# Patient Record
Sex: Female | Born: 1955 | Race: Black or African American | Hispanic: No | Marital: Single | Smoking: Current every day smoker
Health system: Southern US, Community
[De-identification: ages and names within clinical notes are randomized; demographics above are authoritative.]

## PROBLEM LIST (undated history)

## (undated) DIAGNOSIS — E785 Hyperlipidemia, unspecified: Secondary | ICD-10-CM

---

## 2018-03-20 ENCOUNTER — Emergency Department (HOSPITAL_COMMUNITY): Payer: Medicare Other

## 2018-03-20 ENCOUNTER — Other Ambulatory Visit: Payer: Self-pay

## 2018-03-20 ENCOUNTER — Emergency Department (HOSPITAL_COMMUNITY)
Admission: EM | Admit: 2018-03-20 | Discharge: 2018-03-20 | Disposition: A | Discharge status: Home / Self Care | Payer: Medicare Other | Attending: Emergency Medicine | Admitting: Emergency Medicine

## 2018-03-20 ENCOUNTER — Encounter (HOSPITAL_COMMUNITY): Payer: Self-pay | Admitting: *Deleted

## 2018-03-20 DIAGNOSIS — I6523 Occlusion and stenosis of bilateral carotid arteries: Secondary | ICD-10-CM | POA: Diagnosis not present

## 2018-03-20 DIAGNOSIS — Y93A1 Activity, exercise machines primarily for cardiorespiratory conditioning: Secondary | ICD-10-CM | POA: Diagnosis not present

## 2018-03-20 DIAGNOSIS — M79602 Pain in left arm: Secondary | ICD-10-CM | POA: Diagnosis present

## 2018-03-20 DIAGNOSIS — W19XXXA Unspecified fall, initial encounter: Secondary | ICD-10-CM

## 2018-03-20 DIAGNOSIS — W010XXA Fall on same level from slipping, tripping and stumbling without subsequent striking against object, initial encounter: Secondary | ICD-10-CM | POA: Diagnosis not present

## 2018-03-20 DIAGNOSIS — F17200 Nicotine dependence, unspecified, uncomplicated: Secondary | ICD-10-CM | POA: Diagnosis not present

## 2018-03-20 DIAGNOSIS — R52 Pain, unspecified: Secondary | ICD-10-CM

## 2018-03-20 DIAGNOSIS — R0789 Other chest pain: Secondary | ICD-10-CM | POA: Insufficient documentation

## 2018-03-20 DIAGNOSIS — Y998 Other external cause status: Secondary | ICD-10-CM | POA: Diagnosis not present

## 2018-03-20 DIAGNOSIS — R002 Palpitations: Secondary | ICD-10-CM | POA: Diagnosis not present

## 2018-03-20 DIAGNOSIS — Y92009 Unspecified place in unspecified non-institutional (private) residence as the place of occurrence of the external cause: Secondary | ICD-10-CM | POA: Diagnosis not present

## 2018-03-20 HISTORY — DX: Hyperlipidemia, unspecified: E78.5

## 2018-03-20 LAB — BASIC METABOLIC PANEL
Anion gap: 10 (ref 5–15)
BUN: 8 mg/dL (ref 6–20)
CALCIUM: 9.6 mg/dL (ref 8.9–10.3)
CHLORIDE: 108 mmol/L (ref 101–111)
CO2: 24 mmol/L (ref 22–32)
CREATININE: 0.74 mg/dL (ref 0.44–1.00)
GFR calc non Af Amer: >60 mL/min (ref >60)
GLUCOSE: 92 mg/dL (ref 65–99)
Potassium: 3.8 mmol/L (ref 3.5–5.1)
Sodium: 142 mmol/L (ref 135–145)

## 2018-03-20 LAB — CBC WITH DIFFERENTIAL/PLATELET
Basophils Absolute: 0 10*3/uL (ref 0.0–0.1)
Basophils Relative: 1 %
EOS ABS: 0.1 10*3/uL (ref 0.0–0.7)
EOS PCT: 2 %
HCT: 39.4 % (ref 36.0–46.0)
HEMOGLOBIN: 12.9 g/dL (ref 12.0–15.0)
LYMPHS PCT: 60 %
Lymphs Abs: 2.2 10*3/uL (ref 0.7–4.0)
MCH: 29.3 pg (ref 26.0–34.0)
MCHC: 32.7 g/dL (ref 30.0–36.0)
MCV: 89.3 fL (ref 78.0–100.0)
Monocytes Absolute: 0.2 10*3/uL (ref 0.1–1.0)
Monocytes Relative: 6 %
NEUTROS ABS: 1.1 10*3/uL — AB (ref 1.7–7.7)
NEUTROS PCT: 31 %
Platelets: 241 10*3/uL (ref 150–400)
RBC: 4.41 MIL/uL (ref 3.87–5.11)
RDW: 11.9 % (ref 11.5–15.5)
WBC: 3.6 10*3/uL — ABNORMAL LOW (ref 4.0–10.5)

## 2018-03-20 LAB — I-STAT TROPONIN, ED: TROPONIN I, POC: 0 ng/mL (ref 0.00–0.08)

## 2018-03-20 NOTE — ED Provider Notes (Signed)
Patient with mechanical fall several days ago,  Walking on a treadmill occurred she also complains of "palpitations" for several days which come and go.  She denies any chest pain denies lightheadedness.  She is not experiencing any palpitations presently   Doug SouJacubowitz, Ashtin Melichar, MD 03/20/18 1512

## 2018-03-20 NOTE — ED Triage Notes (Signed)
Pt in stating she tripped and fell on her treadmill last week and has ongoing pain to her left arm, pain worse with movement

## 2018-03-20 NOTE — ED Notes (Signed)
Radiology to bring PT back to [facility]

## 2018-03-20 NOTE — ED Notes (Signed)
ED Provider at bedside. 

## 2018-03-20 NOTE — Discharge Instructions (Addendum)
Please take Tylenol (acetaminophen) to relieve your pain.  You may take tylenol, up to 1,000 mg (two extra strength pills).  Do not take more than 3,000 mg tylenol in a 24 hour period.  Please check all medication labels as many medications such as pain and cold medications may contain tylenol. Please do not drink alcohol while taking this medication.   Today the CT scan of your neck showed that you have some wear and tear type changes, however your hardware looks like it is in the right place.  The CT on your neck also showed that you have calcium in both your carotid arteries which are the big arteries that go to your head.  Your blood work looked reassuring, however your white blood cell count was slightly low.  This may be your normal, however recommend that you follow-up with your primary care doctor for both of these, along with your palpitations, ED visit, and symptoms today.  If you have worsening pain, or additional concerns then please seek additional medical care and evaluation.  Please make sure that you are safe when using a treadmill.

## 2018-03-20 NOTE — ED Provider Notes (Signed)
[facility] EMERGENCY DEPARTMENT Provider Note   CSN: [id number] Arrival date & time: 03/20/18  1007     History   Chief Complaint Chief Complaint  Patient presents with  . Fall    HPI Jaclyn Dawson is a 62 y.o. female with a past medical history of hypertension, hyperlipidemia, who presents today for evaluation after she fell off the treadmill on Wednesday.  Jaclyn Dawson reports that since then she has had ongoing left-sided arm and rib pain.  She has been trying heat, and lidocaine cream without relief.  She also reports that whenever she moves around or bends down she feels like her heart muscle is spasming.  She denies any known cardiac history, diaphoresis, nausea or vomiting.  She reports that she has a metal plate in her neck and is concerned that when she fell she may have displaced it.  She however denies neck pain worse than her usual chronic pains.  She reports decreased sensation in her left hand in all fingers except the thumb.  She denies striking her head, reports that this was a mechanical fall as the treadmill was moving too quickly for her.  HPI  Past Medical History:  Diagnosis Date  . Hyperlipemia     Patient Active Problem List   Diagnosis Date Noted  . 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 No pertinent surgical history.   OB History   None      Home Medications    Prior to Admission medications   Not on File    Family History History reviewed. No pertinent family history.  Social History Social History   Tobacco Use  . Smoking status: Current Every Day Smoker  . Smokeless tobacco: Never Used  Substance Use Topics  . Alcohol use: Not on file  . Drug use: Not on file     Allergies   Aspirin; Penicillins; and Tramadol   Review of Systems Review of Systems  Constitutional: Negative for activity change, fatigue and fever.  Eyes: Negative for visual disturbance.  Respiratory: Negative for  shortness of breath.   Cardiovascular: Positive for chest pain and palpitations. Negative for leg swelling.  Musculoskeletal: Positive for neck pain. Negative for back pain and neck stiffness.  Neurological: Negative for weakness and headaches.       Decreased sensation in the left hand.  All other systems reviewed and are negative.    Physical Exam Updated Vital Signs BP 124/90   Pulse 71   Temp 98.2 F (36.8 C) (Oral)   Resp 16   SpO2 99%   Physical Exam  Constitutional: She appears well-developed and well-nourished. No distress.  HENT:  Head: Normocephalic and atraumatic.  Mouth/Throat: Oropharynx is clear and moist.  Eyes: Conjunctivae are normal.  Neck: Normal range of motion. Neck supple.  No midline tenderness to palpation.  Cardiovascular: Normal rate, regular rhythm, normal heart sounds and intact distal pulses. Exam reveals no friction rub.  No murmur heard. Pulmonary/Chest: Effort normal and breath sounds normal. No respiratory distress. She exhibits tenderness (Over left lateral chest.).  Abdominal: Soft. Bowel sounds are normal. She exhibits no distension. There is no tenderness. There is no guarding.  Musculoskeletal: She exhibits no edema.  Patient has generalized pain along the left shoulder.  Unable to re-create the pain with palpation, however it is present with range of motion.  Patient has limited abduction of the left arm secondary to pain, and is not able to raise her arm over  her head.  No obvious crepitus or deformities palpated.  5/5 grip and ankle dorsi/plantar flexion strength bilaterally.  Compartments and bilateral upper and lower extremities are soft and easily compressible.  Neurological: She is alert. She has normal strength. Coordination and gait normal.  Face is symmetrical.  Subjective decreased sensation over the left second through fifth fingers, normal sensation over left thumb.    Skin: Skin is warm and dry.  Psychiatric: She has a normal mood  and affect.  Nursing note and vitals reviewed.    ED Treatments / Results  Labs (all labs ordered are listed, but only abnormal results are displayed) Labs Reviewed  CBC WITH DIFFERENTIAL/PLATELET - Abnormal; Notable for the following components:      Result Value   WBC 3.6 (*)    Neutro Abs 1.1 (*)    All other components within normal limits  BASIC METABOLIC PANEL  I-STAT TROPONIN, ED    EKG     EKG Interpretation  Date/Time:  Friday March 20 2018 13:22:35 EDT Ventricular Rate:  66 PR Interval:    QRS Duration: 95 QT Interval:  402 QTC Calculation: 422 R Axis:   73 Text Interpretation:  Sinus rhythm No old tracing to compare Confirmed by Doug SouJacubowitz, Sam [phone]([id number]) on 03/20/2018 1:33:46 PM Also confirmed by Doug SouJacubowitz, Sam [phone]([id number]), editor Sheppard EvensSimpson, Miranda ([phone])  on 03/20/2018 2:42:55 PM   Radiology Dg Chest 2 View  Result Date: 03/20/2018 CLINICAL DATA:  The left chest and posterior rib pain since a fall 2 weeks ago. Initial encounter. EXAM: CHEST - 2 VIEW COMPARISON:  None. FINDINGS: Calcified granulomata are seen in the right middle lobe. Lungs are otherwise clear. Heart size normal. No pneumothorax or pleural effusion. No acute bony abnormality. The patient is status post cervical fusion. IMPRESSION: No acute disease. Electronically Signed   By: Drusilla Kannerhomas  Dalessio M.D.   On: 03/20/2018 13:44   Dg Ribs Unilateral Left  Result Date: 03/20/2018 CLINICAL DATA:  Larey SeatFell 2 weeks ago, left rib pain and palpitations. EXAM: LEFT RIBS - 2 VIEW COMPARISON:  Chest radiograph March 20, 2018 FINDINGS: No fracture or other bone lesions are seen involving the ribs. IMPRESSION: Negative. Please see dedicated chest radiograph from same day, reported separately. Electronically Signed   By: Awilda Metroourtnay  Bloomer M.D.   On: 03/20/2018 13:44   Ct Cervical Spine Wo Contrast  Result Date: 03/20/2018 CLINICAL DATA:  Left arm pain after tripping and falling on her treadmill last week. EXAM: CT CERVICAL SPINE WITHOUT  CONTRAST TECHNIQUE: Multidetector CT imaging of the cervical spine was performed without intravenous contrast. Multiplanar CT image reconstructions were also generated. COMPARISON:  None. FINDINGS: Alignment: Mild reversal the normal lordosis in the lower cervical spine. No subluxations. Skull base and vertebrae: No acute fracture. No primary bone lesion or focal pathologic process. Soft tissues and spinal canal: No prevertebral fluid or swelling. No visible canal hematoma. Disc levels: Solid interbody bone and anterior hardware fusion at the C3 through C7 levels. C1-2 degenerative changes. Mild facet degenerative changes at multiple levels. Upper chest: Clear lung apices. Other: Bilateral carotid artery calcifications. IMPRESSION: 1. No fracture or subluxation. 2. Postsurgical and degenerative changes, as described above. 3. Bilateral carotid artery atheromatous calcifications. Electronically Signed   By: Beckie SaltsSteven  Reid M.D.   On: 03/20/2018 14:37   Dg Shoulder Left  Result Date: 03/20/2018 CLINICAL DATA:  Fall onto left arm last week with continued pain. Initial encounter. EXAM: LEFT SHOULDER - 2+ VIEW COMPARISON:  None. FINDINGS: There is no  evidence of fracture or dislocation. Mild inferior glenoid spurring. IMPRESSION: No acute finding. Electronically Signed   By: Marnee Spring M.D.   On: 03/20/2018 10:48   Dg Humerus Left  Result Date: 03/20/2018 CLINICAL DATA:  Fall onto left arm last week with continued pain. Initial encounter. EXAM: LEFT HUMERUS - 2+ VIEW COMPARISON:  None. FINDINGS: Negative for fracture or dislocation. Mild acromioclavicular spurring. IMPRESSION: No acute finding. Electronically Signed   By: Marnee Spring M.D.   On: 03/20/2018 10:49    Procedures Procedures (including critical care time)  Medications Ordered in ED Medications - No data to display   Initial Impression / Assessment and Plan / ED Course  I have reviewed the triage vital signs and the nursing  notes.  Pertinent labs & imaging results that were available during my care of the patient were reviewed by me and considered in my medical decision making (see chart for details).    Patient presents today for evaluation of 2 complaints. 1.  Fall: Patient fell on Wednesday, and since then has had pain on her left upper body.  She had subjective decreased sensation in her left hand, raising concern for a neck injury although she does not have neck pain and does not think she strike her head.  She is also had pain in the left side of her chest since this started.  X-rays of left-sided ribs, chest, left shoulder, and left humerus were all obtained without acute abnormalities present.  CT scan of neck was obtained without subluxation or other acute finding.  Patient's tingling is not consistent with a peripheral nerve or dermatomal pattern, low suspicion for emergent cause.  Suspect muscle spasm/contusions causing her pain.  Discussed with patient the need to follow-up with orthopedics that home if her pain continues, she is only visiting [place], and therefore was not referred to a local orthopedist.  Did discuss possibility of soft tissue injury including rotator cuff.    2. Palpitations Patient reports that since her fall she has been having abnormal chest sensations which she equates to palpitations.  Labs were obtained and reviewed, mild leukopenia, initial EKG normal, patient was observed on the heart monitor without cause for her symptoms found.  Recommended caffeine reduction, PCP follow-up.  Return precautions were discussed with patient he states her understanding.  Discharged home with symptomatic care.  She was also informed of incidental findings including bilateral calcification of her carotid arteries and the need to follow-up with her primary care doctor about this.  Final Clinical Impressions(s) / ED Diagnoses   Final diagnoses:  Pain  Fall, initial encounter  Palpitations   Calcification of both carotid arteries    ED Discharge Orders    None       Cristina Gong, Cordelia Poche 03/20/18 1602    Doug Sou, MD 03/20/18 1816

## 2018-03-20 NOTE — ED Notes (Signed)
Patient transported to CT 

## 2018-03-20 NOTE — ED Notes (Signed)
Patient transported to X-ray 

## 2018-03-23 LAB — PATHOLOGIST SMEAR REVIEW

## 2019-02-26 IMAGING — CT CT CERVICAL SPINE W/O CM
3 of 4 series · 12 of 33 positions shown, 14 images · non-contrast
Comparison: None.

CLINICAL DATA: Left arm pain after tripping and falling on her
treadmill last week.

EXAM:
CT CERVICAL SPINE WITHOUT CONTRAST
TECHNIQUE: Multidetector CT imaging of the cervical spine was performed without
intravenous contrast. Multiplanar CT image reconstructions were also
generated.

[Series 4: c_spine 2.0 (person_name) (person_name) · axial · 0.33mm/px · z∈[-184,-64]mm · 4 of 91 slices shown, 5 images]
[im 16/91  soft-tissue]
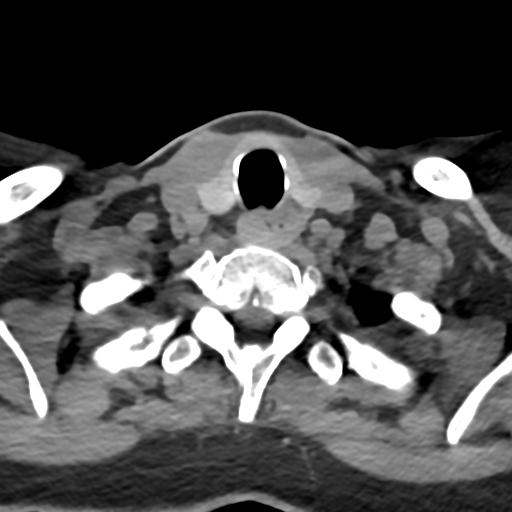
[im 16/91  bone]
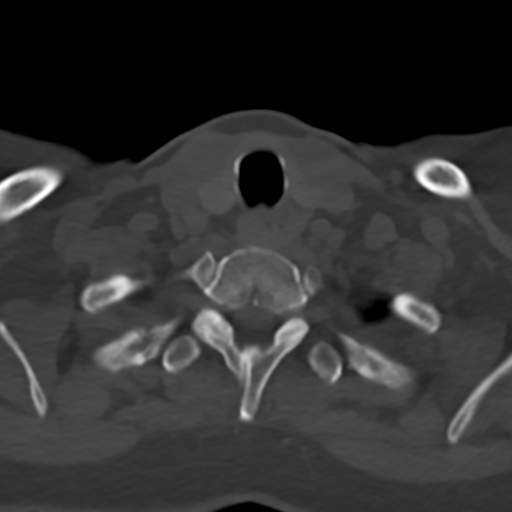
[im 31/91  bone]
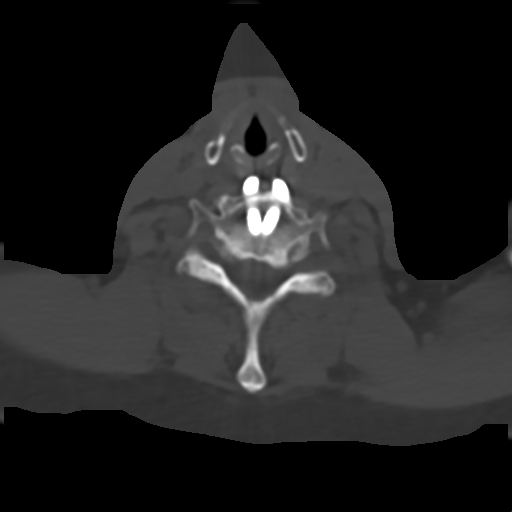
[im 61/91  bone]
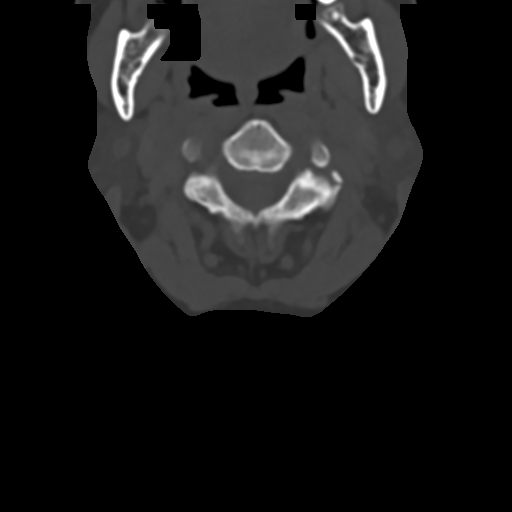
[im 76/91  bone]
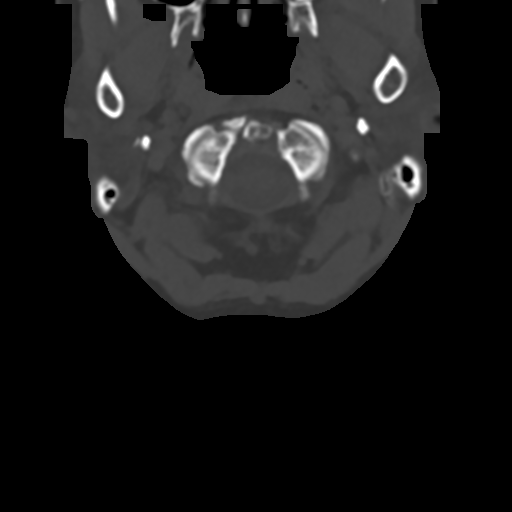

[Series 6: c_spine 2.0 sag bone · sagittal · 0.24mm/px · 5 of 61 slices shown, 6 images]
[im 21/61  bone]
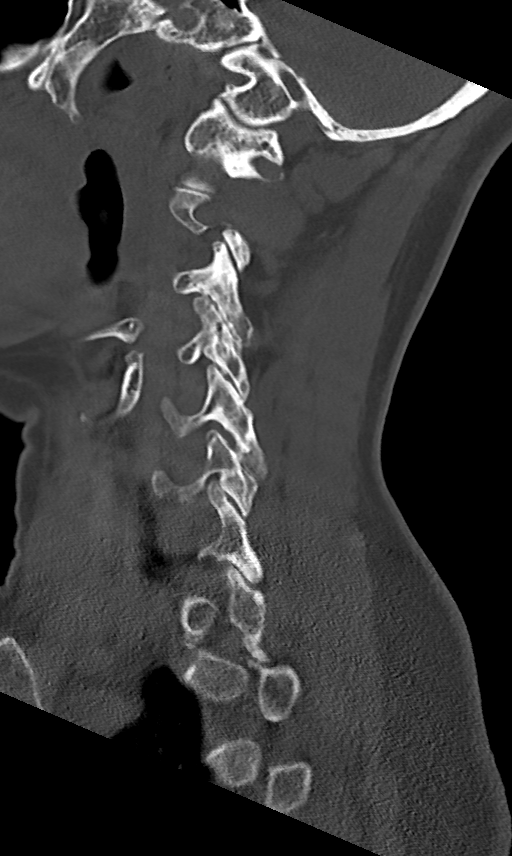
[im 26/61  bone]
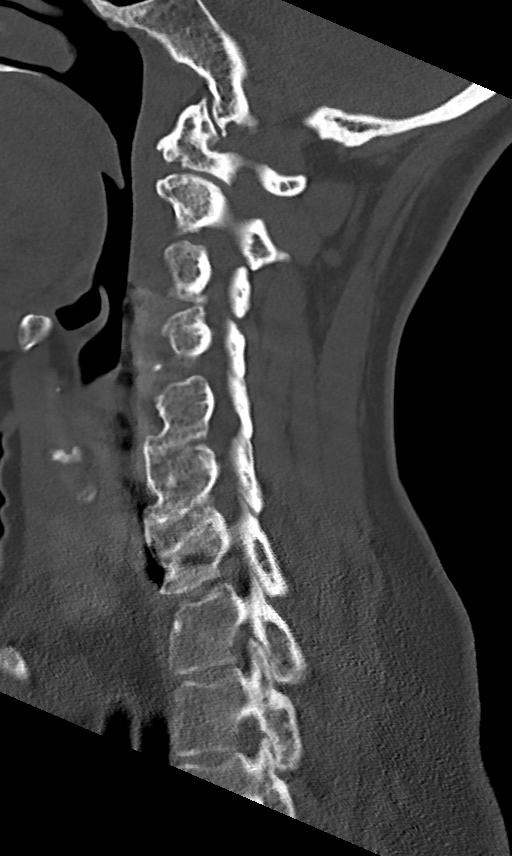
[im 31/61  soft-tissue]
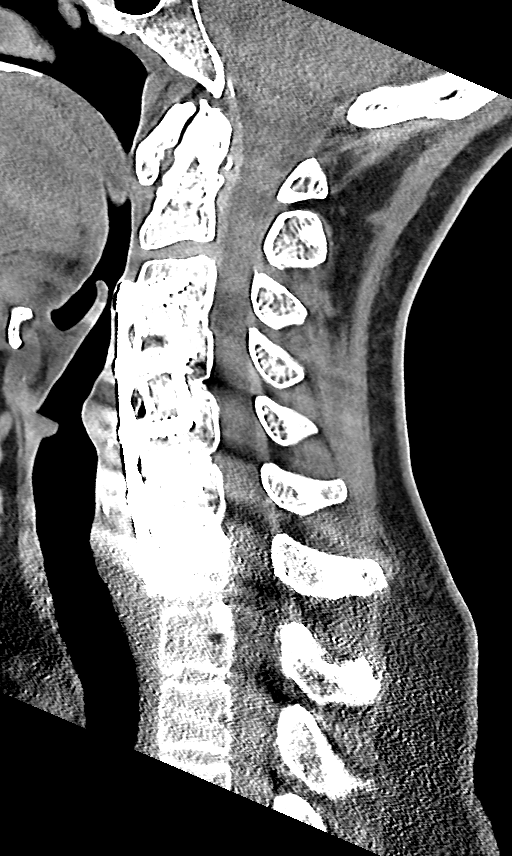
[im 31/61  bone]
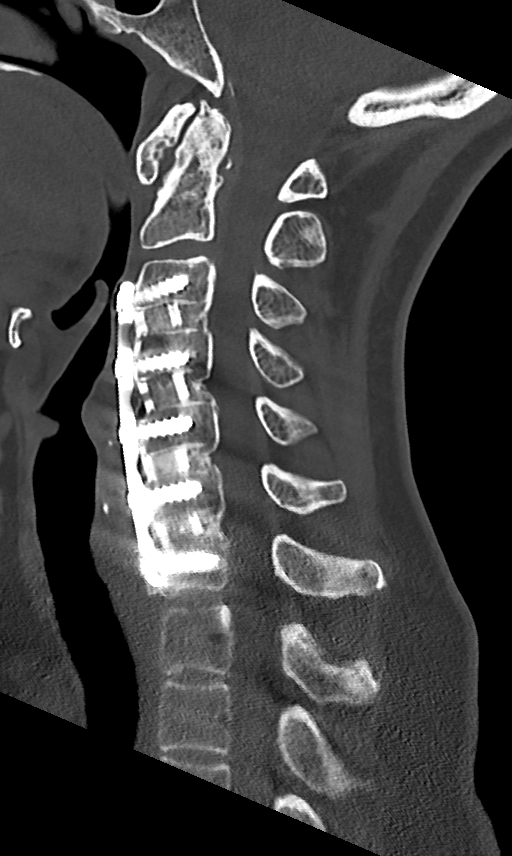
[im 36/61  bone]
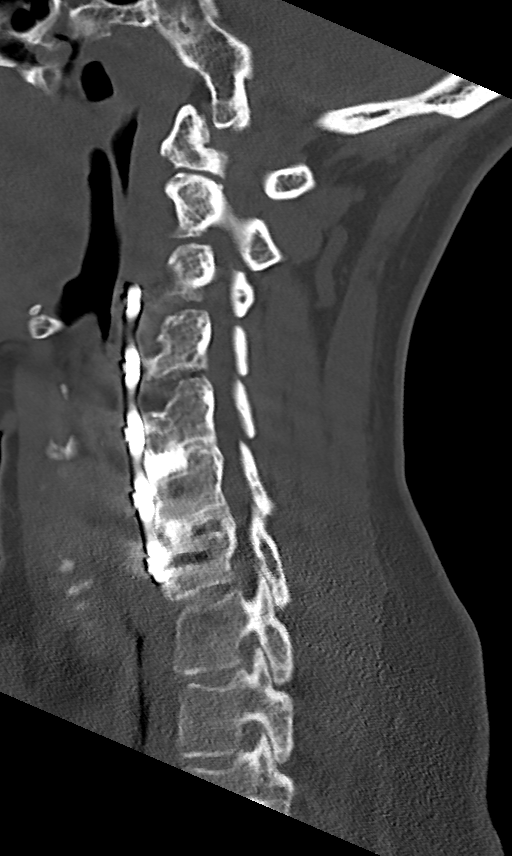
[im 41/61  bone]
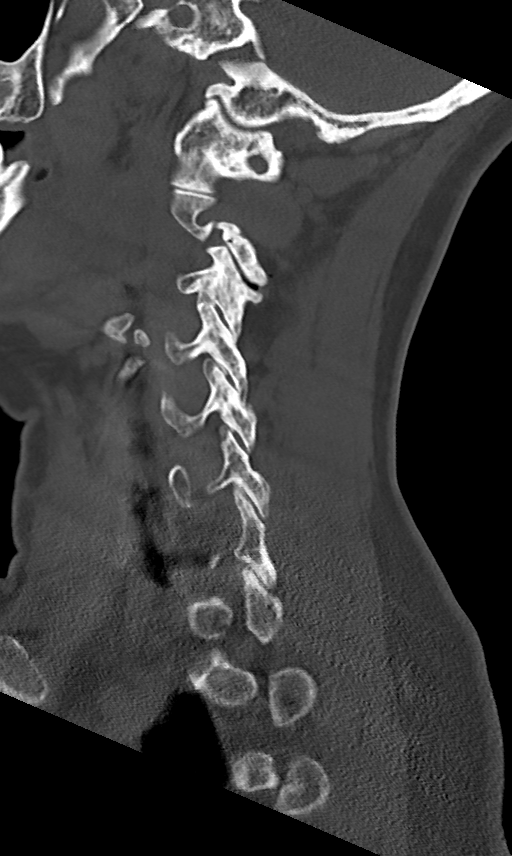

[Series 7: c_spine 2.0 cor bone · coronal · 0.27mm/px · 3 of 61 slices shown]
[im 13/61  bone]
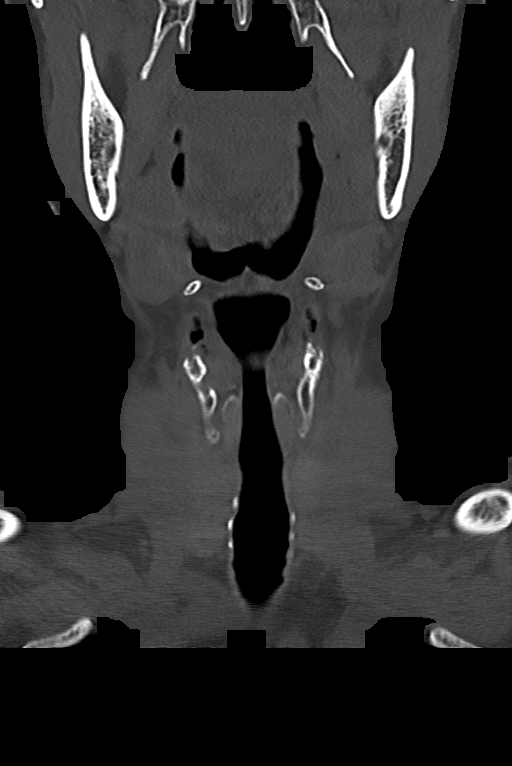
[im 25/61  bone]
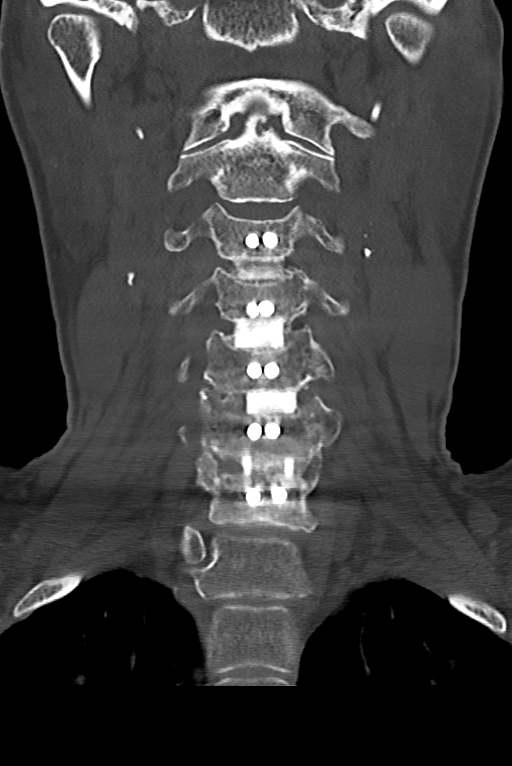
[im 37/61  bone]
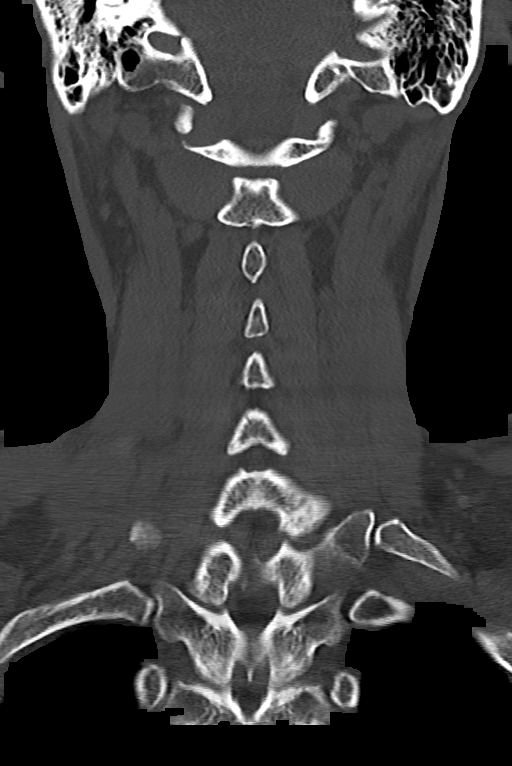

[12 of 33 positions shown; findings below may reference images not displayed]

FINDINGS: Alignment: Mild reversal the normal lordosis in the lower cervical
spine. No subluxations.

Skull base and vertebrae: No acute fracture. No primary bone lesion
or focal pathologic process.

Soft tissues and spinal canal: No prevertebral fluid or swelling. No
visible canal hematoma.

Disc levels: Solid interbody bone and anterior hardware fusion at
the C3 through C7 levels. C1-2 degenerative changes. Mild facet
degenerative changes at multiple levels.

Upper chest: Clear lung apices.

Other: Bilateral carotid artery calcifications.
IMPRESSION: 1. No fracture or subluxation.
2. Postsurgical and degenerative changes, as described above.
3. Bilateral carotid artery atheromatous calcifications.

## 2019-02-26 IMAGING — DX DG RIBS 2V*L*
3 series · 3 of 3 positions shown · non-contrast
Comparison: Chest radiograph March 20, 2018

CLINICAL DATA: Fell 2 weeks ago, left rib pain and palpitations.

EXAM:
LEFT RIBS - 2 VIEW

[w ribs ap lower left]
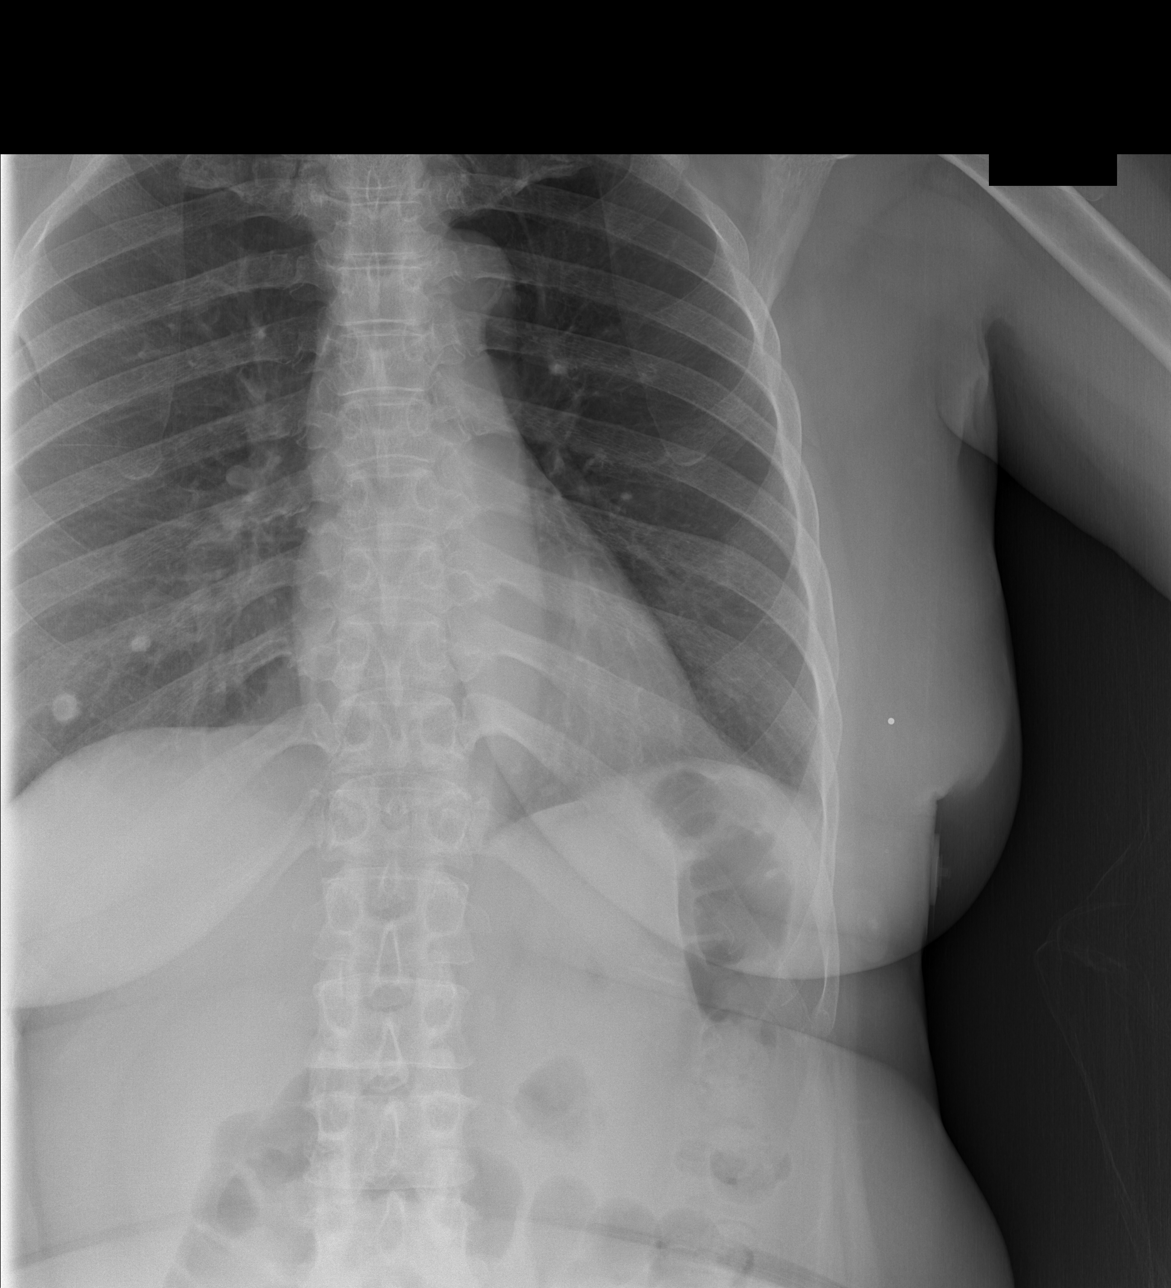

[w ribs obl left (1 of 2)]
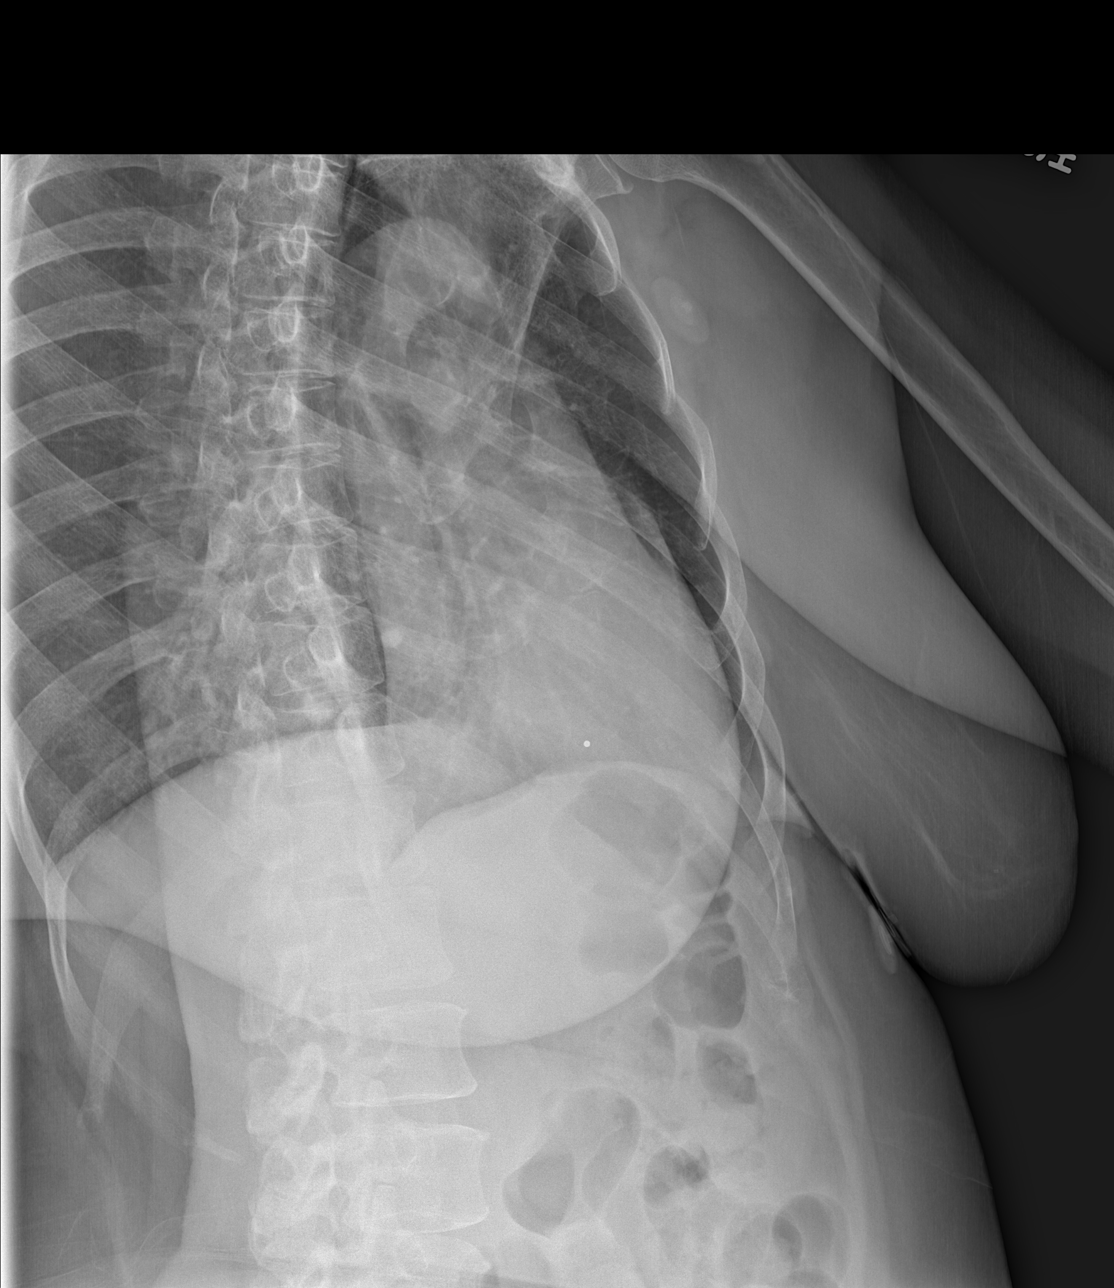

[w ribs obl left (2 of 2)]
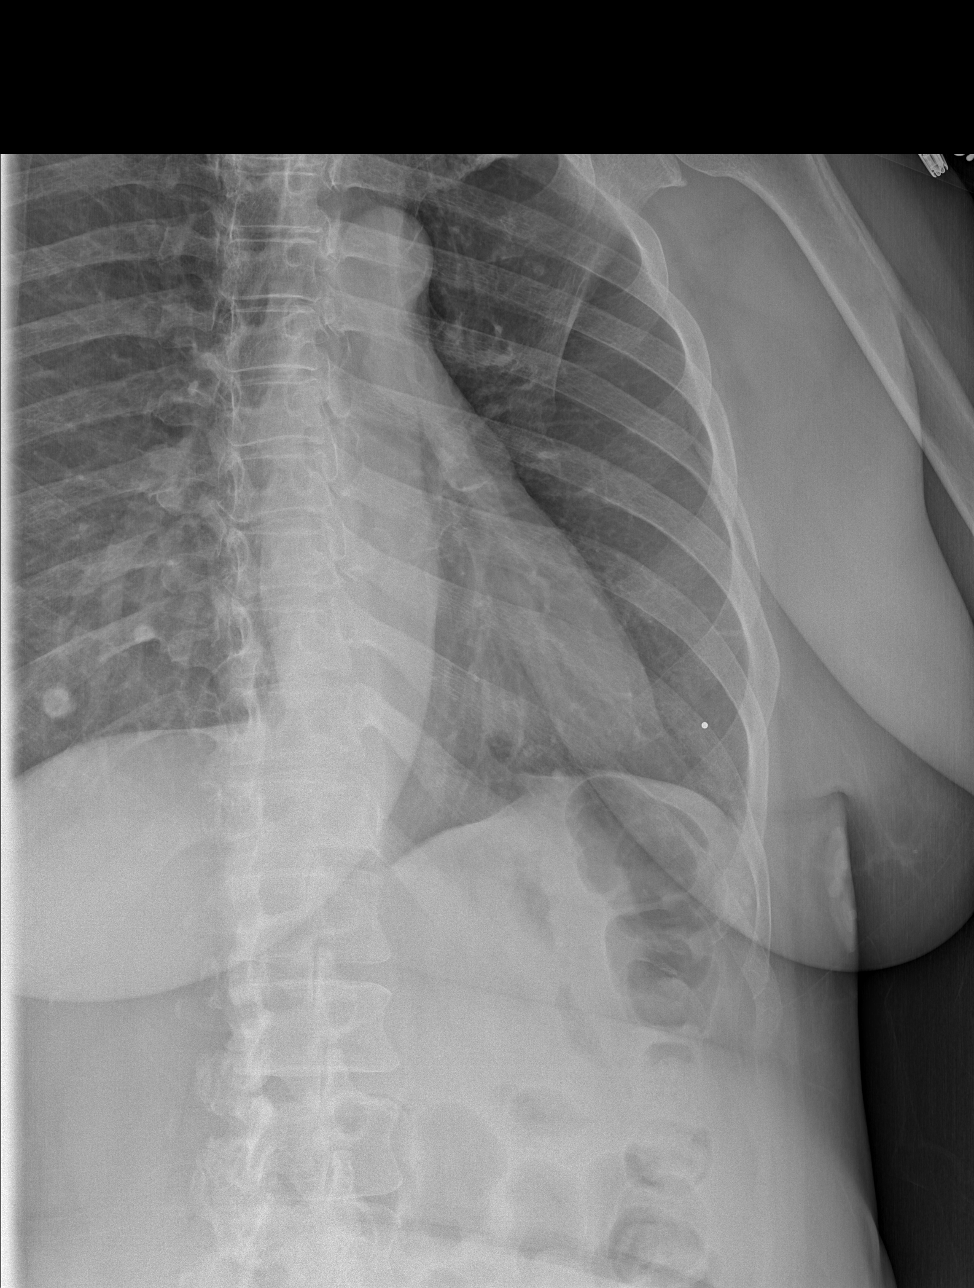

[3 of 3 positions shown; findings below may reference images not displayed]

FINDINGS: No fracture or other bone lesions are seen involving the ribs.
IMPRESSION: Negative.

Please see dedicated chest radiograph from same day, reported
separately.
